# Patient Record
Sex: Male | Born: 1985 | Race: Black or African American | Hispanic: No | Marital: Married | Smoking: Former smoker
Health system: Southern US, Community
[De-identification: ages and names within clinical notes are randomized; demographics above are authoritative.]

---

## 2005-10-13 ENCOUNTER — Emergency Department: Payer: Self-pay | Admitting: Emergency Medicine

## 2005-10-13 ENCOUNTER — Other Ambulatory Visit: Payer: Self-pay

## 2006-08-01 ENCOUNTER — Emergency Department: Payer: Self-pay | Admitting: Unknown Physician Specialty

## 2007-05-05 ENCOUNTER — Emergency Department: Payer: Self-pay | Admitting: Emergency Medicine

## 2007-05-08 ENCOUNTER — Emergency Department: Payer: Self-pay | Admitting: Emergency Medicine

## 2007-05-13 ENCOUNTER — Emergency Department: Payer: Self-pay | Admitting: Emergency Medicine
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
## 2015-06-21 ENCOUNTER — Encounter: Payer: Self-pay | Admitting: Emergency Medicine

## 2015-06-21 ENCOUNTER — Emergency Department: Payer: Managed Care, Other (non HMO)

## 2015-06-21 ENCOUNTER — Emergency Department
Admission: EM | Admit: 2015-06-21 | Discharge: 2015-06-21 | Disposition: A | Discharge status: Home / Self Care | Payer: Managed Care, Other (non HMO) | Attending: Emergency Medicine | Admitting: Emergency Medicine

## 2015-06-21 DIAGNOSIS — J189 Pneumonia, unspecified organism: Secondary | ICD-10-CM | POA: Insufficient documentation

## 2015-06-21 DIAGNOSIS — R509 Fever, unspecified: Secondary | ICD-10-CM | POA: Diagnosis present

## 2015-06-21 LAB — CBC WITH DIFFERENTIAL/PLATELET
BASOS PCT: 0 %
Basophils Absolute: 0 10*3/uL (ref 0–0.1)
EOS ABS: 0 10*3/uL (ref 0–0.7)
Eosinophils Relative: 0 %
HCT: 42.2 % (ref 40.0–52.0)
HEMOGLOBIN: 14.5 g/dL (ref 13.0–18.0)
Lymphocytes Relative: 18 %
Lymphs Abs: 1.1 10*3/uL (ref 1.0–3.6)
MCH: 30.2 pg (ref 26.0–34.0)
MCHC: 34.3 g/dL (ref 32.0–36.0)
MCV: 87.9 fL (ref 80.0–100.0)
MONOS PCT: 13 %
Monocytes Absolute: 0.8 10*3/uL (ref 0.2–1.0)
NEUTROS PCT: 69 %
Neutro Abs: 4.3 10*3/uL (ref 1.4–6.5)
PLATELETS: 242 10*3/uL (ref 150–440)
RBC: 4.8 MIL/uL (ref 4.40–5.90)
RDW: 13.5 % (ref 11.5–14.5)
WBC: 6.3 10*3/uL (ref 3.8–10.6)

## 2015-06-21 LAB — COMPREHENSIVE METABOLIC PANEL
ALBUMIN: 3.7 g/dL (ref 3.5–5.0)
ALT: 52 U/L (ref 17–63)
ANION GAP: 8 (ref 5–15)
AST: 49 U/L — ABNORMAL HIGH (ref 15–41)
Alkaline Phosphatase: 73 U/L (ref 38–126)
BILIRUBIN TOTAL: 0.5 mg/dL (ref 0.3–1.2)
BUN: 17 mg/dL (ref 6–20)
CO2: 28 mmol/L (ref 22–32)
Calcium: 8.7 mg/dL — ABNORMAL LOW (ref 8.9–10.3)
Chloride: 99 mmol/L — ABNORMAL LOW (ref 101–111)
Creatinine, Ser: 1.13 mg/dL (ref 0.61–1.24)
GLUCOSE: 112 mg/dL — AB (ref 65–99)
POTASSIUM: 3 mmol/L — AB (ref 3.5–5.1)
Sodium: 135 mmol/L (ref 135–145)
TOTAL PROTEIN: 7.8 g/dL (ref 6.5–8.1)

## 2015-06-21 LAB — RAPID INFLUENZA A&B ANTIGENS (ARMC ONLY)
INFLUENZA A (ARMC): NEGATIVE
INFLUENZA B (ARMC): NEGATIVE

## 2015-06-21 LAB — URINALYSIS COMPLETE WITH MICROSCOPIC (ARMC ONLY)
BILIRUBIN URINE: NEGATIVE
Glucose, UA: NEGATIVE mg/dL
HGB URINE DIPSTICK: NEGATIVE
KETONES UR: NEGATIVE mg/dL
LEUKOCYTES UA: NEGATIVE
NITRITE: NEGATIVE
PH: 8 (ref 5.0–8.0)
Protein, ur: 100 mg/dL — AB
Specific Gravity, Urine: 1.028 (ref 1.005–1.030)

## 2015-06-21 MED ORDER — IBUPROFEN 600 MG PO TABS
600.0000 mg | ORAL_TABLET | Freq: Once | ORAL | Status: AC
  Administered 2015-06-21: 600 mg via ORAL
  Filled 2015-06-21: qty 1

## 2015-06-21 MED ORDER — AZITHROMYCIN 500 MG PO TABS
500.0000 mg | ORAL_TABLET | Freq: Once | ORAL | Status: AC
  Administered 2015-06-21: 500 mg via ORAL
  Filled 2015-06-21: qty 1

## 2015-06-21 MED ORDER — AZITHROMYCIN 250 MG PO TABS: ORAL_TABLET | ORAL | Status: AC

## 2015-06-21 MED ORDER — DEXTROSE 5 % IV SOLN
1.0000 g | Freq: Once | INTRAVENOUS | Status: AC
  Administered 2015-06-21: 1 g via INTRAVENOUS
  Filled 2015-06-21: qty 10

## 2015-06-21 MED ORDER — ACETAMINOPHEN 325 MG PO TABS
650.0000 mg | ORAL_TABLET | Freq: Once | ORAL | Status: AC | PRN
  Administered 2015-06-21: 650 mg via ORAL

## 2015-06-21 MED ORDER — ACETAMINOPHEN 325 MG PO TABS
ORAL_TABLET | ORAL | Status: AC
  Administered 2015-06-21: 650 mg via ORAL
  Filled 2015-06-21: qty 2

## 2015-06-21 MED ORDER — AMOXICILLIN-POT CLAVULANATE 875-125 MG PO TABS
1.0000 | ORAL_TABLET | Freq: Two times a day (BID) | ORAL | Status: AC
Start: 2015-06-21 — End: 2015-07-01

## 2015-06-21 MED ORDER — SODIUM CHLORIDE 0.9 % IV BOLUS (SEPSIS)
1000.0000 mL | Freq: Once | INTRAVENOUS | Status: AC
  Administered 2015-06-21: 1000 mL via INTRAVENOUS

## 2015-06-21 NOTE — ED Notes (Signed)
Patient states that he has been running fever since Thursday. Temp has been up to 104.0, patient has been using Tylenol, this brings temp down to 99.0 but then once tylenol wears off temp goes back up. Patient denies cough, runny nose, N/V/D.

## 2015-06-21 NOTE — ED Notes (Addendum)
Pt reports 650 tylenol too but at 1145. Will retreat with tylenol

## 2015-06-21 NOTE — ED Notes (Signed)
Fever has been up and down since Thursday per pt as high as 104.  Took aleve at 1200 today for temp 104.  Has not had bowel movement since Thursday.  Denies abdominal pain, cough, etc.

## 2015-06-21 NOTE — ED Provider Notes (Signed)
[facility] Emergency Department Provider Note  ____________________________________________   I have reviewed the triage vital signs and the nursing notes.   HISTORY  Chief Complaint Fever and Constipation    HPI Craig Herrera is a 30 y.o. male who presents today with fever and slight cough. Patient states that he has had no nausea vomiting or diarrhea. He states the fevers been there since Thursday. He has some mild body aches. He states he has been mildly constipated he's had no diarrhea no abdominal pain. Patient denies anyother symptoms. He states he does not feel bad but he is concerned about the fevers.     History reviewed. No pertinent past medical history.  There are no active problems to display for this patient.   History reviewed. No pertinent past surgical history.  No current outpatient prescriptions on file.  Allergies Review of patient's allergies indicates no known allergies.  History reviewed. No pertinent family history.  Social History Social History  Substance Use Topics  . Smoking status: Never Smoker   . Smokeless tobacco: None  . Alcohol Use: Yes    Review of Systems }Constitutional: Positive fever/chills Eyes: No visual changes. ENT: No sore throat. No stiff neck no neck pain Cardiovascular: Denies chest pain. Respiratory: Denies shortness of breath. Gastrointestinal:   no vomiting.  No diarrhea.  No constipation. Genitourinary: Negative for dysuria. Musculoskeletal: Negative lower extremity swelling Skin: Negative for rash. Neurological: Negative for headaches, focal weakness or numbness. 10-point ROS otherwise negative.  ____________________________________________   PHYSICAL EXAM:  VITAL SIGNS: ED Triage Vitals  Enc Vitals Group     BP 06/21/15 1640 144/85 mmHg     Pulse Rate 06/21/15 1640 116     Resp 06/21/15 1640 18     Temp 06/21/15 1640 103.2 F (39.6 C)     Temp Source 06/21/15  1640 Oral     SpO2 06/21/15 1640 96 %     Weight 06/21/15 1640 250 lb (113.399 kg)     Height 06/21/15 1640  (1.854 m)     Head Cir --      Peak Flow --      Pain Score 06/21/15 1850 5     Pain Loc --      Pain Edu? --      Excl. in GC? --     Constitutional: Alert and oriented. Well appearing and in no acute distress. Eyes: Conjunctivae are normal. PERRL. EOMI. Head: Atraumatic. Nose: No congestion/rhinnorhea. Mouth/Throat: Mucous membranes are moist.  Oropharynx non-erythematous. Neck: No stridor.   Nontender with no meningismus Cardiovascular: Normal rate, regular rhythm. Grossly normal heart sounds.  Good peripheral circulation. Respiratory: Normal respiratory effort.  No retractions. Lungs CTAB. Abdominal: Soft and nontender. No distention. No guarding no reboundNormal bowel sounds Back:  There is no focal tenderness or step off there is no midline tenderness there are no lesions noted. there is no CVA tenderness Musculoskeletal: No lower extremity tenderness. No joint effusions, no DVT signs strong distal pulses no edema Neurologic:  Normal speech and language. No gross focal neurologic deficits are appreciated.  Skin:  Skin is warm, dry and intact. No rash noted. Psychiatric: Mood and affect are normal. Speech and behavior are normal.  ____________________________________________   LABS (all labs ordered are listed, but only abnormal results are displayed)  Labs Reviewed  COMPREHENSIVE METABOLIC PANEL - Abnormal; Notable for the following:    Potassium 3.0 (*)    Chloride 99 (*)  Glucose, Bld 112 (*)    Calcium 8.7 (*)    AST 49 (*)    All other components within normal limits  URINALYSIS COMPLETEWITH MICROSCOPIC ([facility] ONLY) - Abnormal; Notable for the following:    Color, Urine YELLOW (*)    APPearance CLEAR (*)    Protein, ur 100 (*)    Bacteria, UA RARE (*)    Squamous Epithelial / LPF 0-5 (*)    All other components within normal limits  RAPID  INFLUENZA A&B ANTIGENS ([facility] ONLY)  CULTURE, BLOOD (ROUTINE X 2)  CULTURE, BLOOD (ROUTINE X 2)  URINE CULTURE  CBC WITH DIFFERENTIAL/PLATELET   ____________________________________________  EKG  I personally interpreted any EKGs ordered by me or triage  ____________________________________________  RADIOLOGY  I reviewed any imaging ordered by me or triage that were performed during my shift and, if possible, patient and/or family made aware of any abnormal findings. ____________________________________________   PROCEDURES  Procedure(s) performed: None  Critical Care performed: None  ____________________________________________   INITIAL IMPRESSION / ASSESSMENT AND PLAN / ED COURSE  Pertinent labs & imaging results that were available during my care of the patient were reviewed by me and considered in my medical decision making (see chart for details).  Patient with fever initially he denied cough, but on pressing he states he has coughed "a little". Chest x-ray shows a pneumonia. No elevated white count, electrolytes don't show any evidence of significant dehydration nor does urine not acidotic. Patient well appearing. Serial abdominal exams are benign. All of his flu test is negative I do suspect this is likely flu however given that there is a paternal chest x-ray will treat him for CAPD using coverage for typical and atypical. Patient will be given a work note for tomorrow. We will replete his potassium. He feels much better now that his fever is coming down. We have advised antipyretics with Tylenol succeeded by Motrin at home. Extensive return precautions given. There is no evidence of intra-abdominal pathology response with for his symptoms. ____________________________________________   FINAL CLINICAL IMPRESSION(S) / ED DIAGNOSES  Final diagnoses:  None      This chart was dictated using voice recognition software.  Despite best efforts to proofread,  errors can  occur which can change meaning.     Jeanmarie PlantJames A Kyleen Villatoro, MD 06/21/15 2156

## 2015-06-21 NOTE — Discharge Instructions (Signed)

## 2015-06-21 NOTE — ED Notes (Signed)
Report given to Rachel, RN.

## 2015-06-22 LAB — URINE CULTURE

## 2015-06-26 LAB — CULTURE, BLOOD (ROUTINE X 2)
CULTURE: NO GROWTH
CULTURE: NO GROWTH

## 2017-05-26 IMAGING — CR DG CHEST 2V
1 series · 2 of 2 positions shown · non-contrast
Comparison: None.

CLINICAL DATA: Three day history of fever.

EXAM:
CHEST  2 VIEW

[Series 1: dg chest 2 view · 0.14mm/px · 2 of 2 slices shown]
[im 1/2]
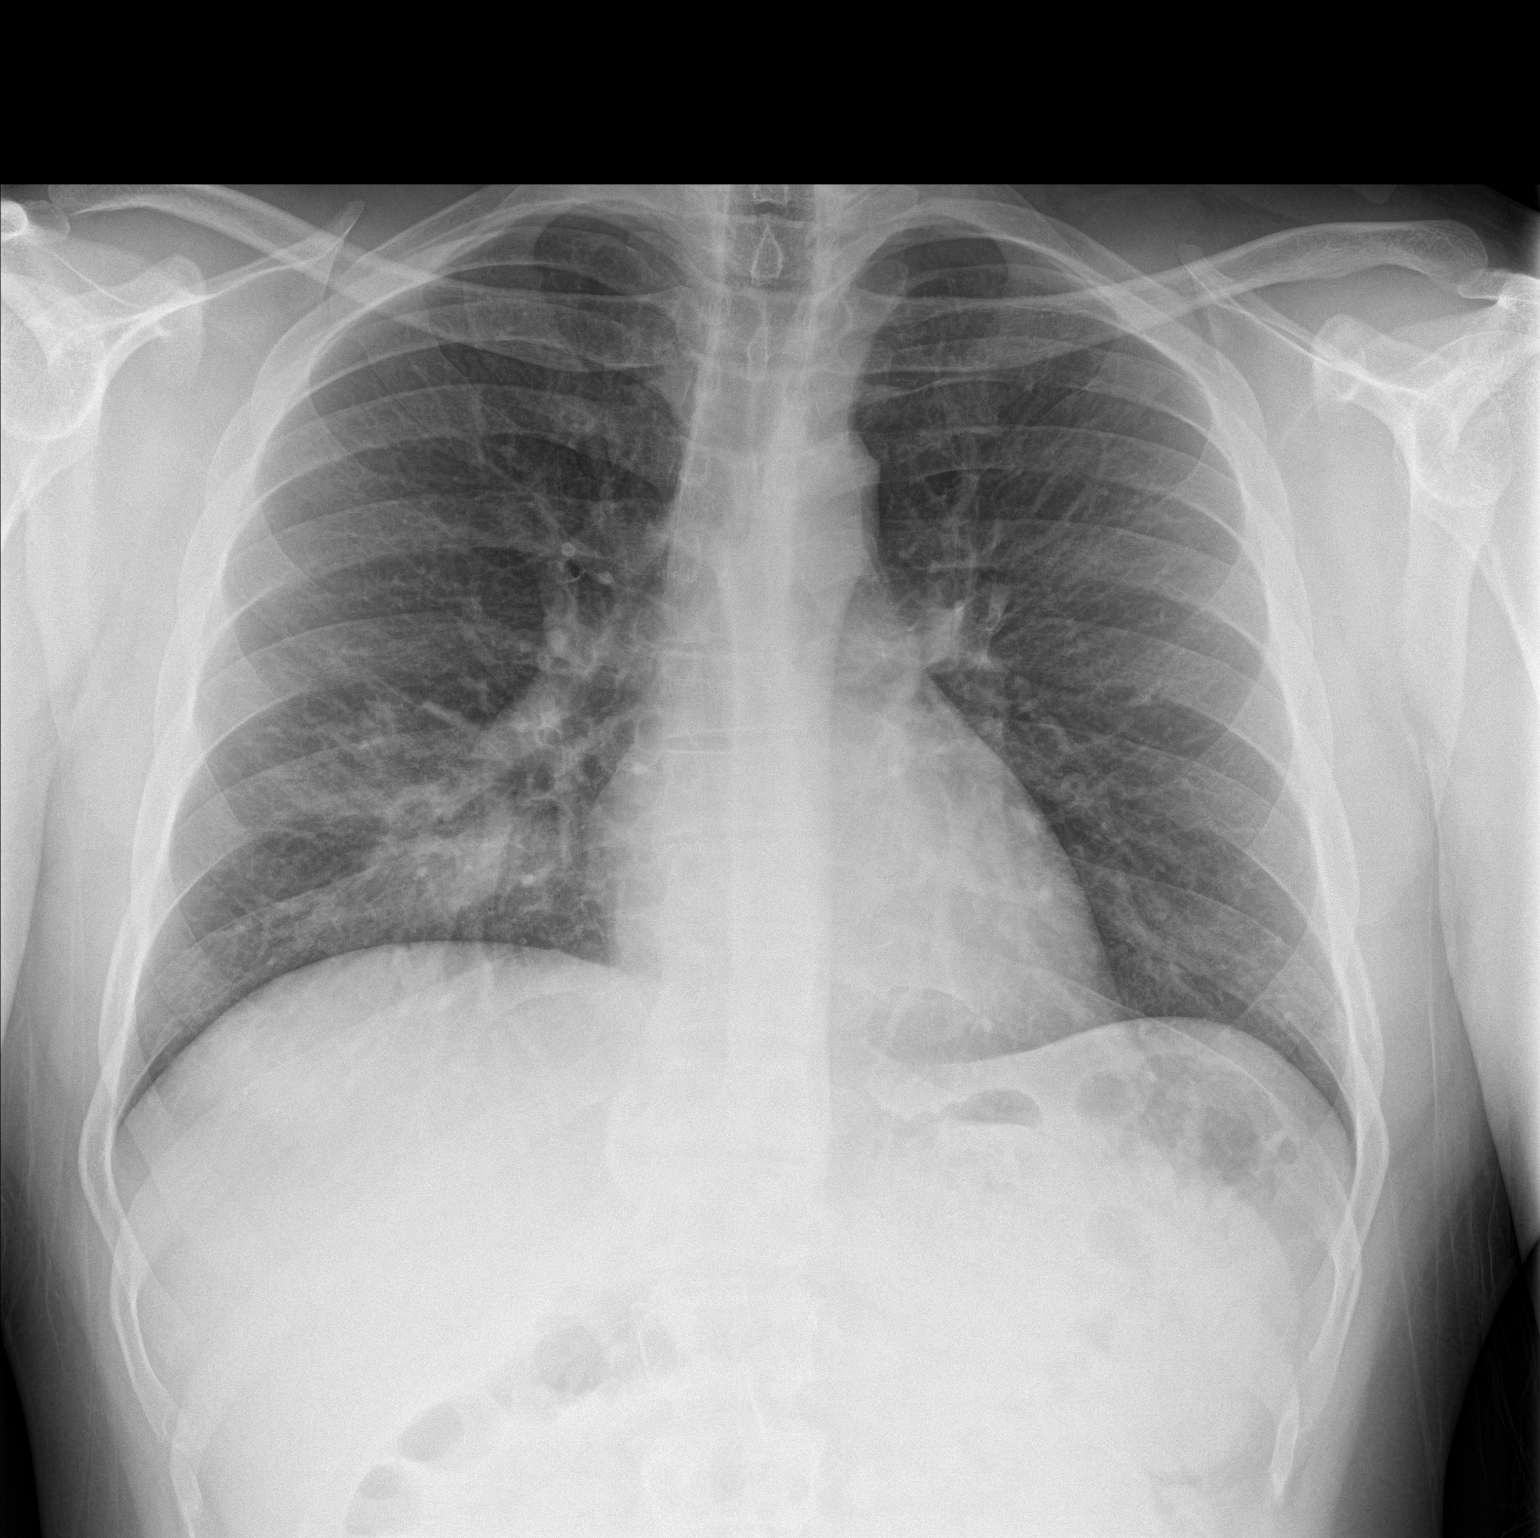
[im 2/2]
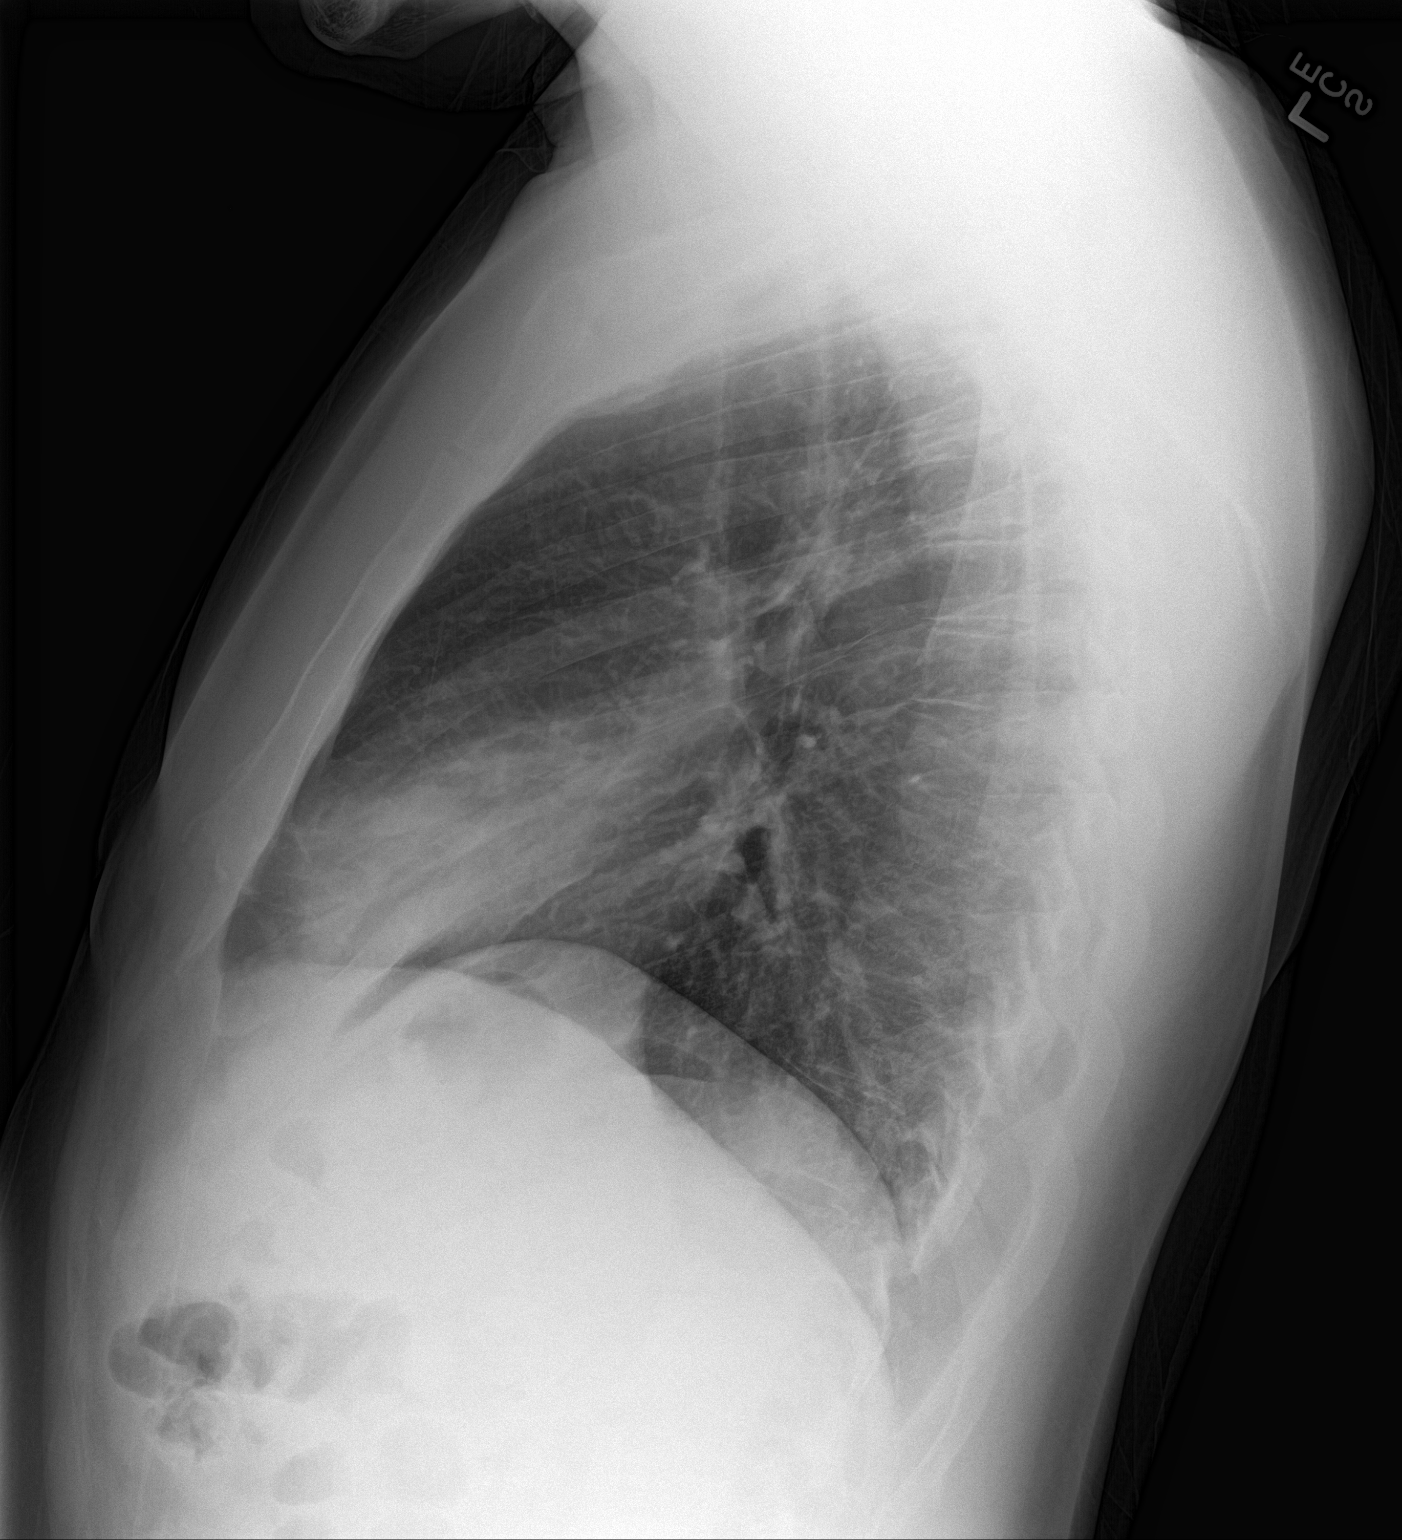

[2 of 2 positions shown; findings below may reference images not displayed]

FINDINGS: The cardiac silhouette, mediastinal and hilar contours are normal.
Airspace opacity in the right middle lobe consistent with pneumonia.
The left lung is clear. No pleural effusions. The bony thorax is
normal.
IMPRESSION: Right middle lobe pneumonia.

## 2021-04-07 ENCOUNTER — Ambulatory Visit: Payer: 59

## 2021-12-26 ENCOUNTER — Encounter: Payer: Self-pay | Admitting: Emergency Medicine

## 2021-12-26 ENCOUNTER — Other Ambulatory Visit: Payer: Self-pay

## 2021-12-26 ENCOUNTER — Emergency Department
Admission: EM | Admit: 2021-12-26 | Discharge: 2021-12-26 | Discharge status: Against Medical Advice | Payer: 59 | Attending: Student | Admitting: Student

## 2021-12-26 ENCOUNTER — Emergency Department: Payer: 59

## 2021-12-26 DIAGNOSIS — M79641 Pain in right hand: Secondary | ICD-10-CM | POA: Insufficient documentation

## 2021-12-26 DIAGNOSIS — Y92410 Unspecified street and highway as the place of occurrence of the external cause: Secondary | ICD-10-CM | POA: Insufficient documentation

## 2021-12-26 DIAGNOSIS — Z5321 Procedure and treatment not carried out due to patient leaving prior to being seen by health care provider: Secondary | ICD-10-CM | POA: Diagnosis not present
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
## 2024-02-01 ENCOUNTER — Encounter: Payer: Self-pay | Admitting: Dermatology

## 2024-02-01 ENCOUNTER — Ambulatory Visit: Admitting: Dermatology

## 2024-02-01 DIAGNOSIS — L309 Dermatitis, unspecified: Secondary | ICD-10-CM | POA: Diagnosis not present

## 2024-02-01 DIAGNOSIS — Z79899 Other long term (current) drug therapy: Secondary | ICD-10-CM

## 2024-02-01 DIAGNOSIS — Z7189 Other specified counseling: Secondary | ICD-10-CM

## 2024-02-01 MED ORDER — CLOBETASOL PROPIONATE 0.05 % EX CREA: 1.0000 | TOPICAL_CREAM | Freq: Two times a day (BID) | CUTANEOUS | 0 refills | Status: AC

## 2024-02-01 NOTE — Patient Instructions (Signed)
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
## 2024-02-29 ENCOUNTER — Ambulatory Visit: Admitting: Dermatology

## 2024-03-07 ENCOUNTER — Ambulatory Visit: Admitting: Dermatology
